# Patient Record
Sex: Male | Born: 2000 | Race: White | Hispanic: No | Marital: Single | Smoking: Never smoker
Health system: Southern US, Community
[De-identification: ages and names within clinical notes are randomized; demographics above are authoritative.]

---

## 2020-01-04 ENCOUNTER — Ambulatory Visit: Payer: Self-pay | Attending: Internal Medicine

## 2020-01-04 DIAGNOSIS — Z23 Encounter for immunization: Secondary | ICD-10-CM

## 2020-01-04 NOTE — Progress Notes (Signed)
   Covid-19 Vaccination Clinic  Name:  Gary Mcmillan    MRN: [id number] DOB: 2001/01/28  01/04/2020  Mr. Halladay was observed post Covid-19 immunization for 15 minutes without incident. He was provided with Vaccine Information Sheet and instruction to access the V-Safe system.   Mr. Richison was instructed to call 911 with any severe reactions post vaccine: Marland Kitchen Difficulty breathing  . Swelling of face and throat  . A fast heartbeat  . A bad rash all over body  . Dizziness and weakness   Immunizations Administered    Name Date Dose VIS Date Route   Pfizer COVID-19 Vaccine 01/04/2020  1:29 PM 0.3 mL 09/18/2019 Intramuscular   Manufacturer: [employer], [employer]   Lot: [id number]   NDC: [address]-1

## 2020-01-25 ENCOUNTER — Ambulatory Visit: Payer: Self-pay | Attending: Internal Medicine

## 2020-01-25 DIAGNOSIS — Z23 Encounter for immunization: Secondary | ICD-10-CM

## 2020-01-25 NOTE — Progress Notes (Signed)
   Covid-19 Vaccination Clinic  Name:  Gary Mcmillan    MRN: [id number] DOB: Oct 30, 2000  01/25/2020  [name] was observed post Covid-19 immunization for 15 minutes without incident. He was provided with Vaccine Information Sheet and instruction to access the V-Safe system.   [name] was instructed to call 911 with any severe reactions post vaccine: Marland Kitchen Difficulty breathing  . Swelling of face and throat  . A fast heartbeat  . A bad rash all over body  . Dizziness and weakness   Immunizations Administered    Name Date Dose VIS Date Route   Pfizer COVID-19 Vaccine 01/25/2020  1:08 PM 0.3 mL 12/02/2018 Intramuscular   Manufacturer: [employer], [employer]   Lot: [id number]   NDC: [address]-4

## 2020-10-29 ENCOUNTER — Emergency Department
Admission: EM | Admit: 2020-10-29 | Discharge: 2020-10-29 | Disposition: A | Discharge status: Home / Self Care | Payer: Managed Care, Other (non HMO) | Attending: Emergency Medicine | Admitting: Emergency Medicine

## 2020-10-29 ENCOUNTER — Encounter: Payer: Self-pay | Admitting: Emergency Medicine

## 2020-10-29 ENCOUNTER — Emergency Department: Payer: Managed Care, Other (non HMO)

## 2020-10-29 ENCOUNTER — Other Ambulatory Visit: Payer: Self-pay

## 2020-10-29 DIAGNOSIS — J189 Pneumonia, unspecified organism: Secondary | ICD-10-CM | POA: Insufficient documentation

## 2020-10-29 DIAGNOSIS — R69 Illness, unspecified: Secondary | ICD-10-CM | POA: Diagnosis present

## 2020-10-29 DIAGNOSIS — Z20822 Contact with and (suspected) exposure to covid-19: Secondary | ICD-10-CM | POA: Diagnosis not present

## 2020-10-29 MED ORDER — AZITHROMYCIN 250 MG PO TABS: ORAL_TABLET | ORAL | 0 refills | Status: AC

## 2020-10-29 MED ORDER — BENZONATATE 100 MG PO CAPS: 100.0000 mg | ORAL_CAPSULE | Freq: Three times a day (TID) | ORAL | 0 refills | Status: AC | PRN

## 2020-10-29 MED ORDER — AMOXICILLIN 875 MG PO TABS: 875.0000 mg | ORAL_TABLET | Freq: Two times a day (BID) | ORAL | 0 refills | Status: AC

## 2020-10-29 MED ORDER — BENZONATATE 100 MG PO CAPS
100.0000 mg | ORAL_CAPSULE | Freq: Once | ORAL | Status: AC
  Administered 2020-10-29: 100 mg via ORAL
  Filled 2020-10-29: qty 1

## 2020-10-29 MED ORDER — AMOXICILLIN 500 MG PO CAPS
1000.0000 mg | ORAL_CAPSULE | Freq: Once | ORAL | Status: AC
  Administered 2020-10-29: 1000 mg via ORAL
  Filled 2020-10-29: qty 2

## 2020-10-29 MED ORDER — AZITHROMYCIN 500 MG PO TABS
500.0000 mg | ORAL_TABLET | Freq: Once | ORAL | Status: AC
  Administered 2020-10-29: 500 mg via ORAL
  Filled 2020-10-29: qty 1

## 2020-10-29 MED ORDER — ONDANSETRON 4 MG PO TBDP
4.0000 mg | ORAL_TABLET | Freq: Once | ORAL | Status: AC
  Administered 2020-10-29: 4 mg via ORAL
  Filled 2020-10-29: qty 1

## 2020-10-29 MED ORDER — ONDANSETRON 4 MG PO TBDP: 4.0000 mg | ORAL_TABLET | Freq: Three times a day (TID) | ORAL | 0 refills | Status: AC | PRN

## 2020-10-29 NOTE — ED Triage Notes (Signed)
Pt via POV from home. Pt c/o nasal congestion, emesis, fever, chills, and cough for the past week. Pt states he had the flu over winter break. Pt states he was tested for COVID on Wednesday at [facility] pt states it was only a rapid test. Pt is A&OX4 and NAD.

## 2020-10-29 NOTE — ED Provider Notes (Signed)
[facility] Emergency Department Provider Note  ___________________________________________   Event Date/Time   First MD Initiated Contact with Patient 10/29/20 1956     (approximate)  I have reviewed the triage vital signs and the nursing notes.   HISTORY  Chief Complaint Nasal Congestion, Emesis, Fatigue, and Chills  HPI Gary Mcmillan is a 20 y.o. male who presents to the emergency department for evaluation of acute illness.  Patient symptoms initially began over winter break when he was diagnosed with the flu on December 12.  All of the symptoms had resolved except for a cough, which have been persistent over the last 5 weeks.  Over the last week, the patient began getting fever, chills, worsening cough, fatigue and sore throat.  He has thrown up in the morning for the last 3 days but has otherwise been able to keep food down.  Describes his cough as productive and blood-tinged.  Patient is a Consulting civil engineer at [employer], has been vaccinated against COVID-19 but has not yet been boosted.  [facility] at which time he had a fever of 100.1, but he otherwise does not have a thermometer at home to check his temperature.  He has had 3-4 rapid COVID tests over the last week which have all been negative.  He denies any other known sick exposures.  He denies any shortness of breath or difficulty breathing, denies chest pain, denies abdominal pain.      No past medical history on file.  There are no problems to display for this patient.    Prior to Admission medications   Medication Sig Start Date End Date Taking? Authorizing Provider  amoxicillin (AMOXIL) 875 MG tablet Take 1 tablet (875 mg total) by mouth 2 (two) times daily. 10/29/20  Yes Lucy Chris, PA  azithromycin (ZITHROMAX Z-PAK) 250 MG tablet Take 2 tablets (500 mg) on  Day 1,  followed by 1 tablet (250 mg) once daily on Days 2 through 5. 10/29/20 11/03/20 Yes Arrianna Catala, Ruben Gottron, PA   benzonatate (TESSALON PERLES) 100 MG capsule Take 1 capsule (100 mg total) by mouth 3 (three) times daily as needed for cough. 10/29/20 10/29/21 Yes Jacque Garrels, Ruben Gottron, PA  ondansetron (ZOFRAN ODT) 4 MG disintegrating tablet Take 1 tablet (4 mg total) by mouth every 8 (eight) hours as needed for nausea or vomiting. 10/29/20  Yes Lucy Chris, PA    Allergies Patient has no known allergies.  No family history on file.  Social History Social History   Tobacco Use  . Smoking status: Never Smoker  . Smokeless tobacco: Never Used  Substance Use Topics  . Alcohol use: Yes    Comment: occasionally  . Drug use: Yes    Frequency: 3.0 times per week    Types: Marijuana    Review of Systems Constitutional: + fever/chills Eyes: No visual changes. ENT: + sore throat. Cardiovascular: Denies chest pain. Respiratory: + Cough, denies shortness of breath. Gastrointestinal: No abdominal pain.  No nausea, + vomiting.  No diarrhea.  No constipation. Genitourinary: Negative for dysuria. Musculoskeletal: Negative for back pain. Skin: Negative for rash. Neurological: Negative for headaches, focal weakness or numbness. ____________________________________________   PHYSICAL EXAM:  VITAL SIGNS: ED Triage Vitals  Enc Vitals Group     BP 10/29/20 1702 (!) 124/104     Pulse Rate 10/29/20 1702 96     Resp 10/29/20 1702 16     Temp 10/29/20 1702 98.9 F (37.2 C)  Temp Source 10/29/20 1702 Oral     SpO2 10/29/20 1702 98 %     Weight 10/29/20 1702 116 lb (52.6 kg)     Height 10/29/20 1702 5\' 7"  (1.702 m)     Head Circumference --      Peak Flow --      Pain Score 10/29/20 1722 0     Pain Loc --      Pain Edu? --      Excl. in GC? --    Constitutional: Alert and oriented. Well appearing and in no acute distress. Eyes: Conjunctivae are normal. PERRL. EOMI. Head: Atraumatic. Nose: No congestion/rhinnorhea. Mouth/Throat: Mucous membranes are moist.  Oropharynx erythematous without  any tonsillar enlargement or exudate. Neck: No stridor.   Lymphatic: No cervical lymphadenopathy Cardiovascular: Normal rate, regular rhythm. Grossly normal heart sounds.  Good peripheral circulation. Respiratory: Normal respiratory effort.  No retractions. Lungs CTAB. Gastrointestinal: Soft and nontender. No distention. No abdominal bruits. No CVA tenderness. Musculoskeletal: No lower extremity tenderness nor edema.  No joint effusions. Neurologic:  Normal speech and language. No gross focal neurologic deficits are appreciated. No gait instability. Skin:  Skin is warm, dry and intact. No rash noted. Psychiatric: Mood and affect are normal. Speech and behavior are normal.   ____________________________________________  RADIOLOGY I, 10/31/20, personally viewed and evaluated these images (plain radiographs) as part of my medical decision making, as well as reviewing the written report by the radiologist.  ED provider interpretation: 1 view of chest does not reveal any obvious acute pneumonia  Official radiology report(s): DG Chest Portable 1 View  Result Date: 10/29/2020 CLINICAL DATA:  Cough, fever, nasal congestion, vomiting, and chills. EXAM: PORTABLE CHEST 1 VIEW COMPARISON:  None. FINDINGS: Hyperinflation. Normal heart size and pulmonary vascularity. No focal airspace disease or consolidation in the lungs. No blunting of costophrenic angles. No pneumothorax. Mediastinal contours appear intact. IMPRESSION: Hyperinflation. No evidence of active pulmonary disease. Electronically Signed   By: 10/31/2020 M.D.   On: 10/29/2020 20:57    ____________________________________________   INITIAL IMPRESSION / ASSESSMENT AND PLAN / ED COURSE  As part of my medical decision making, I reviewed the following data within the electronic MEDICAL RECORD NUMBER Nursing notes reviewed and incorporated, Radiograph reviewed and Notes from prior ED visits        Patient is a 20 year old male  who presents to the emergency department with acute illness.  Was diagnosed about 5 weeks ago with COVID and has had persisting cough over the last 5 weeks with worsening this week to include a new bout of nasal congestion, fever, chills and emesis.  He is vaccinated against COVID and has been tested with multiple rapid test and was negative.  See HPI for further details.  In triage, patient's vitals are grossly reassuring with a mild elevation of the patient's diastolic blood pressure.  Otherwise, SPO2 and respirations are normal and the patient's not tachycardic.  On physical exam, the patient does have an erythematous oropharynx without any tonsillar enlargement or exudate, no cervical lymphadenopathy.  There are no specific adventitious breath sounds appreciated on auscultation.  Chest x-ray was obtained with one-view portable and does not demonstrate any obvious acute pneumonia, however this is a limited exam with obtaining only a 1 view.  We will obtain a repeat COVID test by PCR given the increased accuracy with this test compared to the rapid antigen test.  In the interim, patient does have blood-tinged sputum with fever with 5 weeks  of persistent cough after flu.  Given this, we will cover the patient for community-acquired pneumonia.  We will begin treatment with azithromycin, amoxicillin as well as symptomatic treatment with Tessalon Perles and Zofran.  Patient is amenable with this plan.  He will return for any acute worsening including chest pain and shortness of breath.  Patient is stable this time for outpatient therapy.      ____________________________________________   FINAL CLINICAL IMPRESSION(S) / ED DIAGNOSES  Final diagnoses:  Community acquired pneumonia, unspecified laterality  Suspected COVID-19 virus infection     ED Discharge Orders         Ordered    amoxicillin (AMOXIL) 875 MG tablet  2 times daily        10/29/20 2109    azithromycin (ZITHROMAX Z-PAK) 250 MG tablet         10/29/20 2109    ondansetron (ZOFRAN ODT) 4 MG disintegrating tablet  Every 8 hours PRN        10/29/20 2109    benzonatate (TESSALON PERLES) 100 MG capsule  3 times daily PRN        10/29/20 2109          *Please note:  Gary Mcmillan was evaluated in Emergency Department on 10/30/2020 for the symptoms described in the history of present illness. He was evaluated in the context of the global COVID-19 pandemic, which necessitated consideration that the patient might be at risk for infection with the SARS-CoV-2 virus that causes COVID-19. Institutional protocols and algorithms that pertain to the evaluation of patients at risk for COVID-19 are in a state of rapid change based on information released by regulatory bodies including the CDC and federal and state organizations. These policies and algorithms were followed during the patient's care in the ED.  Some ED evaluations and interventions may be delayed as a result of limited staffing during and the pandemic.*   Note:  This document was prepared using Dragon voice recognition software and may include unintentional dictation errors.   Lucy Chris, PA 10/30/20 1658    Sharman Cheek, MD 10/30/20 2245

## 2020-10-30 LAB — SARS CORONAVIRUS 2 (TAT 6-24 HRS): SARS Coronavirus 2: NEGATIVE

## 2022-05-27 IMAGING — DX DG CHEST 1V PORT
1 series · 1 of 1 positions shown · non-contrast
Comparison: None.

CLINICAL DATA: Cough, fever, nasal congestion, vomiting, and
chills.

EXAM:
PORTABLE CHEST 1 VIEW

[chest ap]
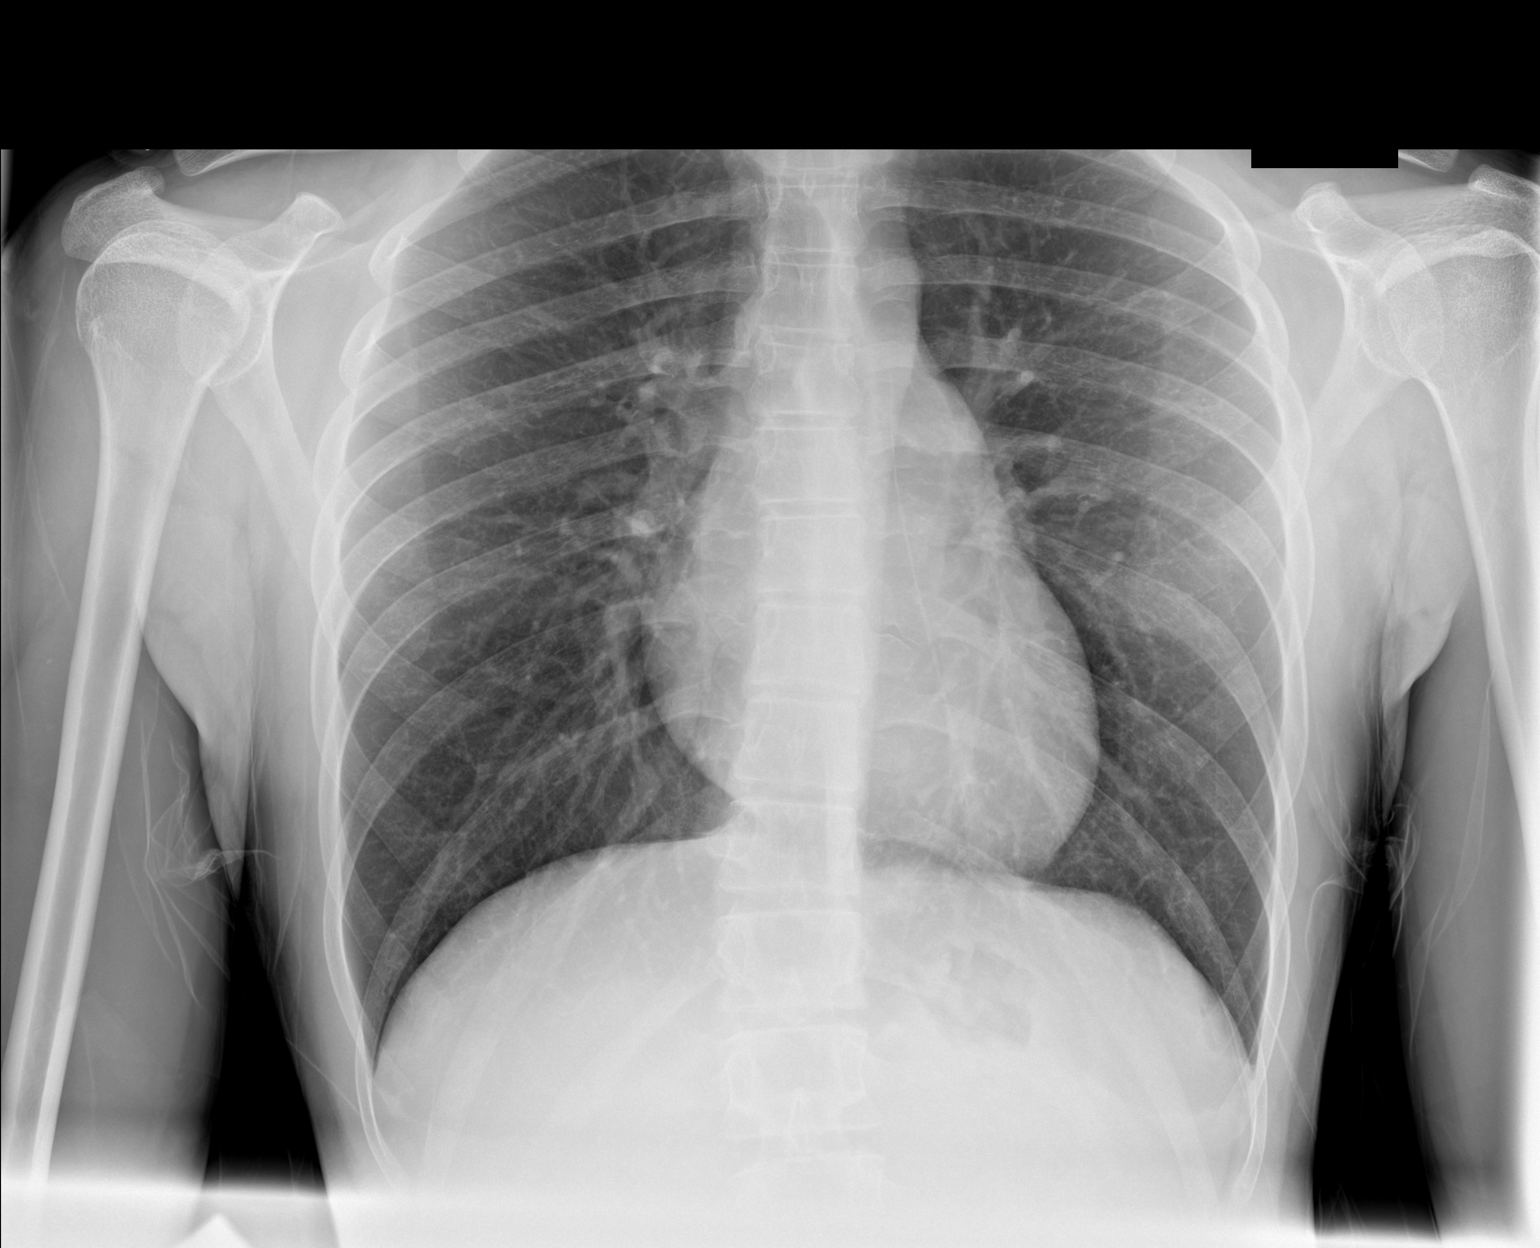

[1 of 1 positions shown; findings below may reference images not displayed]

FINDINGS: Hyperinflation. Normal heart size and pulmonary vascularity. No
focal airspace disease or consolidation in the lungs. No blunting of
costophrenic angles. No pneumothorax. Mediastinal contours appear
intact.
IMPRESSION: Hyperinflation. No evidence of active pulmonary disease.
